# Patient Record
Sex: Male | Born: 1955 | Race: White | Hispanic: No | Marital: Married | Smoking: Former smoker
Health system: Southern US, Community
[De-identification: ages and names within clinical notes are randomized; demographics above are authoritative.]

## PROBLEM LIST (undated history)

## (undated) DIAGNOSIS — T07XXXA Unspecified multiple injuries, initial encounter: Secondary | ICD-10-CM

## (undated) DIAGNOSIS — M1990 Unspecified osteoarthritis, unspecified site: Secondary | ICD-10-CM

## (undated) DIAGNOSIS — S42009A Fracture of unspecified part of unspecified clavicle, initial encounter for closed fracture: Secondary | ICD-10-CM

## (undated) DIAGNOSIS — K219 Gastro-esophageal reflux disease without esophagitis: Secondary | ICD-10-CM

## (undated) DIAGNOSIS — N529 Male erectile dysfunction, unspecified: Secondary | ICD-10-CM

## (undated) HISTORY — DX: Fracture of unspecified part of unspecified clavicle, initial encounter for closed fracture: S42.009A

## (undated) HISTORY — DX: Gastro-esophageal reflux disease without esophagitis: K21.9

## (undated) HISTORY — DX: Unspecified osteoarthritis, unspecified site: M19.90

## (undated) HISTORY — DX: Unspecified multiple injuries, initial encounter: T07.XXXA

## (undated) HISTORY — PX: FINGER FRACTURE SURGERY: SHX638

## (undated) HISTORY — DX: Male erectile dysfunction, unspecified: N52.9

---

## 1996-02-08 HISTORY — PX: THROAT SURGERY: SHX803

## 2004-09-07 ENCOUNTER — Emergency Department: Payer: Self-pay | Admitting: Emergency Medicine

## 2007-03-28 ENCOUNTER — Ambulatory Visit: Payer: Self-pay | Admitting: Unknown Physician Specialty

## 2007-07-02 ENCOUNTER — Ambulatory Visit: Payer: Self-pay | Admitting: Surgery

## 2009-06-03 ENCOUNTER — Ambulatory Visit: Payer: Self-pay | Admitting: Gastroenterology

## 2010-01-01 IMAGING — RF DG BARIUM SWALLOW
1 series · 15 of 24 positions shown · non-contrast
Comparison: none

REASON FOR EXAM: reflux
COMMENTS:

[Series 1: run · 19 acquisitions, 15 frames shown]
[im 1/19]
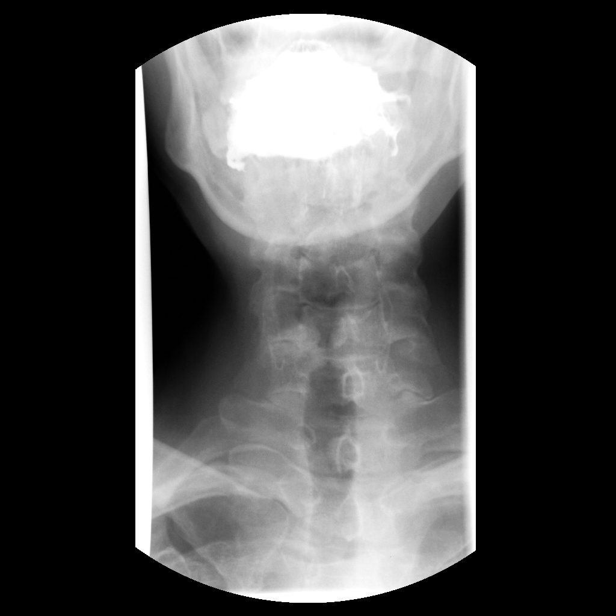
[im 1/19]
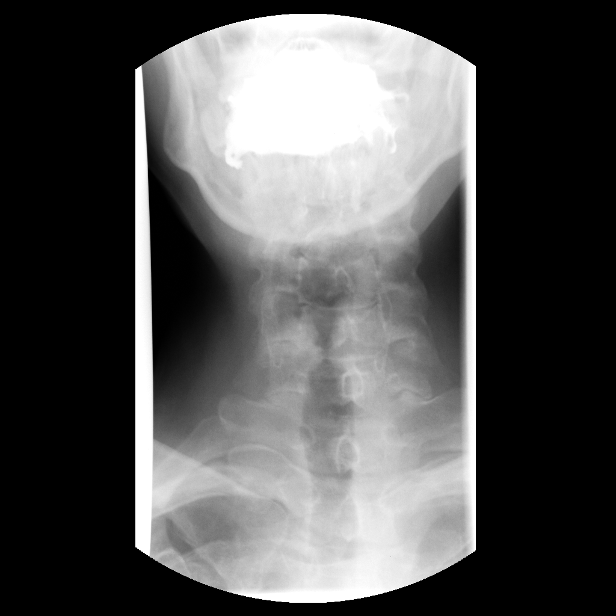
[im 2/19]
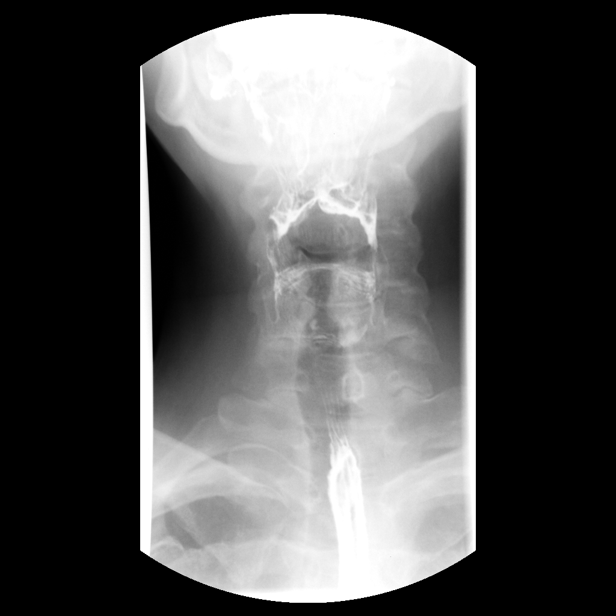
[im 2/19]
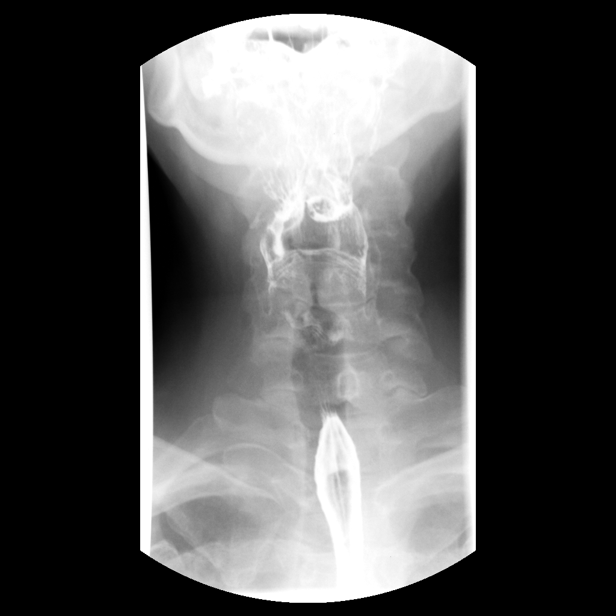
[im 3/19]
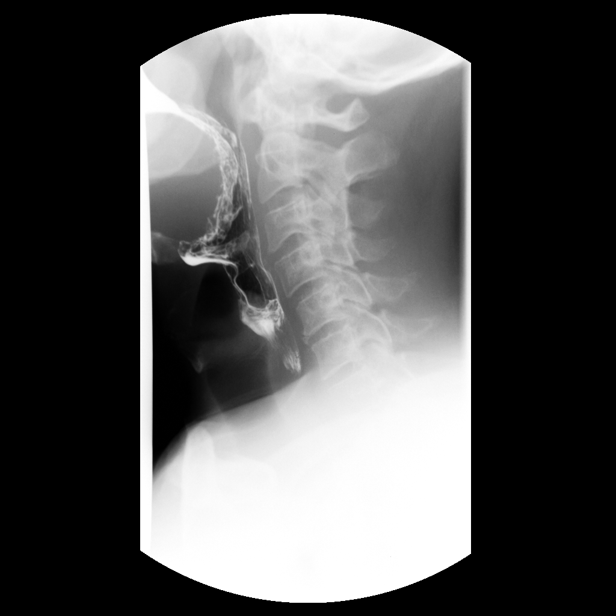
[im 3/19]
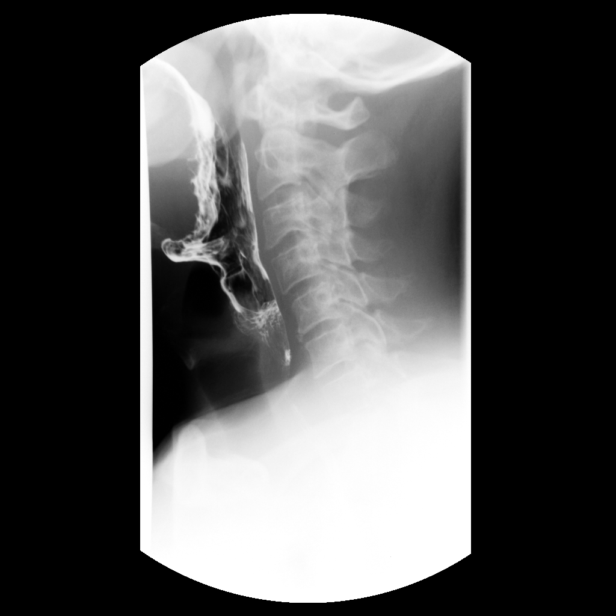
[im 4/19]
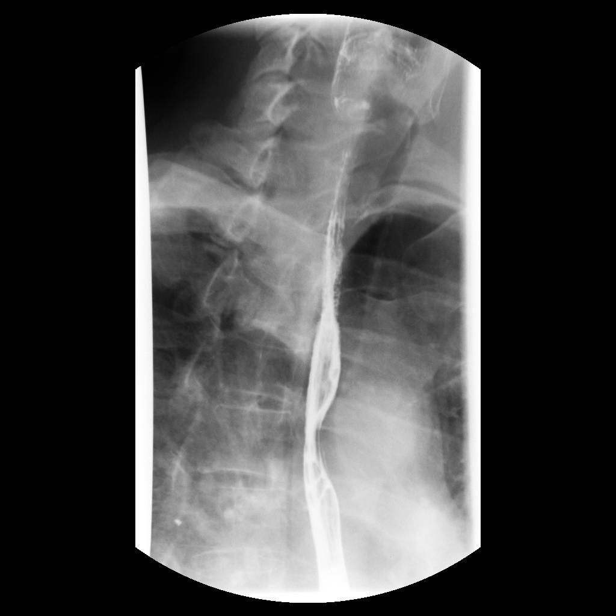
[im 7/19]
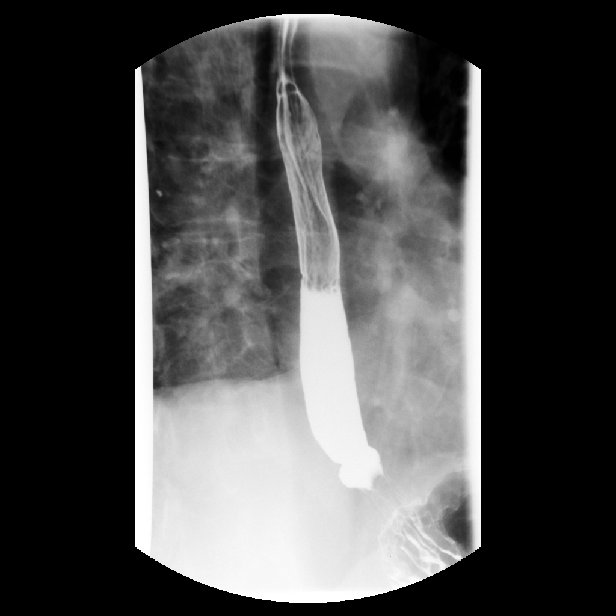
[im 8/19]
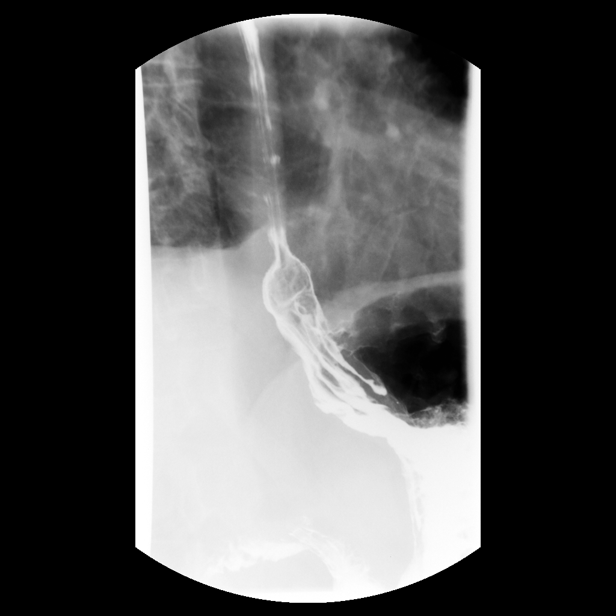
[im 10/19]
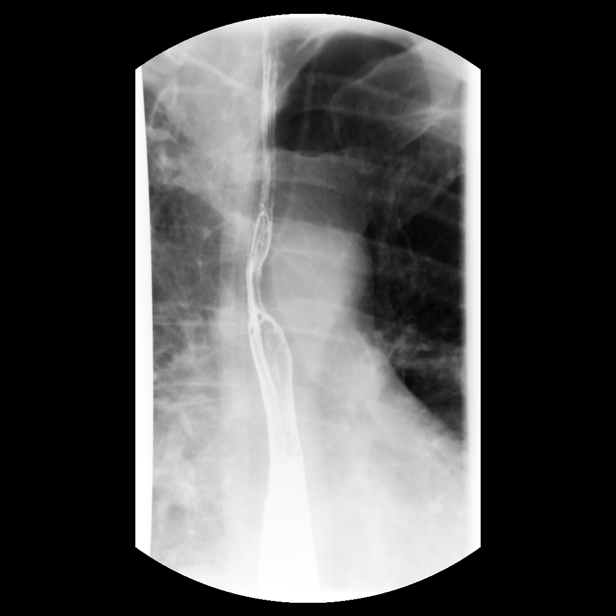
[im 11/19]
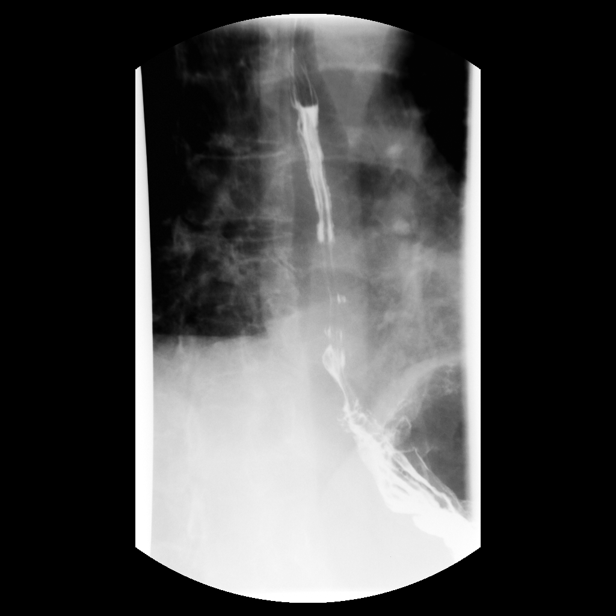
[im 13/19]
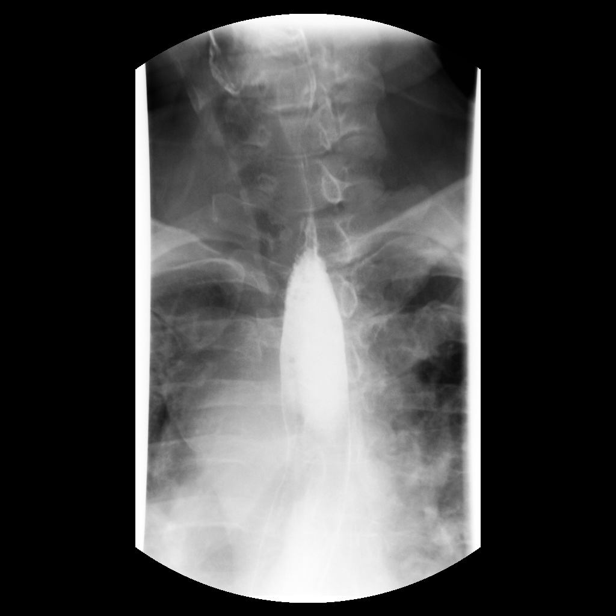
[im 16/19]
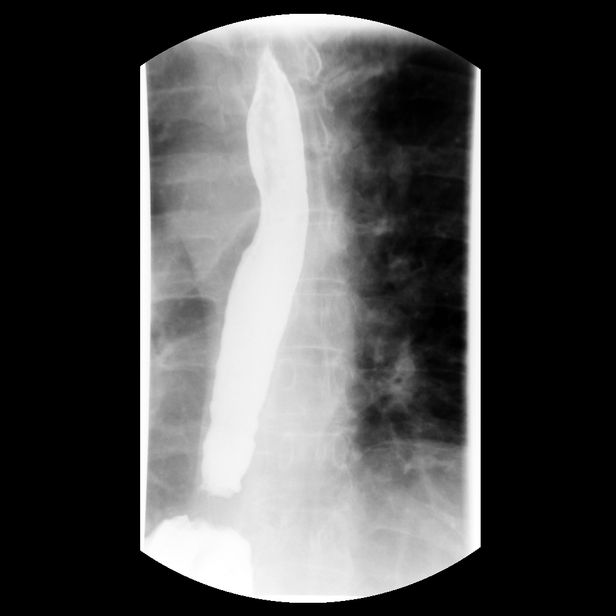
[im 17/19]
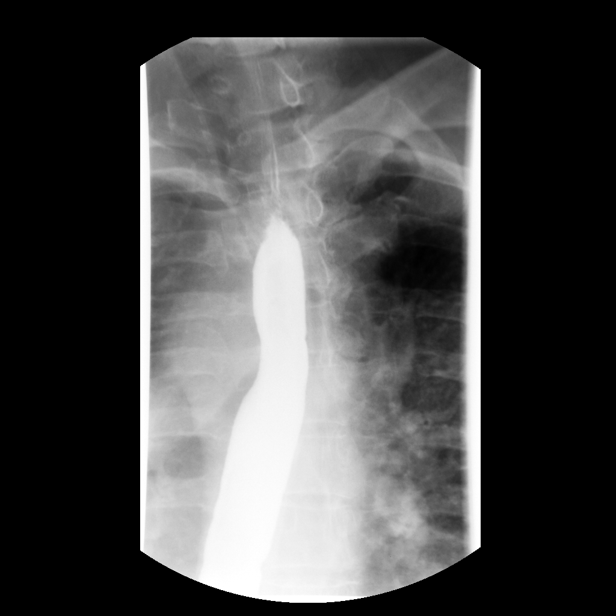
[im 19/19]
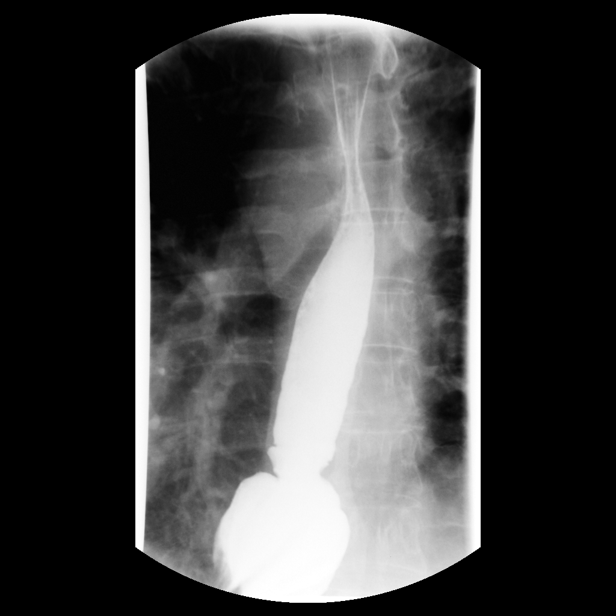

[15 of 24 positions shown; findings below may reference images not displayed]

PROCEDURE:     FL  - FL BARIUM SWALLOW  - July 02, 2007  [DATE]

RESULT:     The patient was given oral barium status post administration of
effervescent crystals and the esophagus was evaluated.

Dynamic evaluation of the cervical esophagus during swallowing demonstrates
no radiographic abnormalities. The thoracic esophagus demonstrates no
evidence of filling defects, focal outpouchings, or fusiform dilatation.
There is appropriate relaxation of the lower esophageal sphincter. A small
to moderate size hiatal hernia is appreciated within the distal esophagus at
the level of the gastroesophageal junction. This appears to be fixed.
Maneuvers were performed to elicit gastroesophageal reflux and the patient
demonstrated reflux to the upper esophagus. Evaluation of a single swallow
demonstrated no evidence of esophageal dysmotility. A 12.5 mm Barotab was
administered which traversed the esophagus without complications.
IMPRESSION: 1.Small to moderate hiatal hernia.

2.Gastroesophageal reflux as described above.

## 2010-11-17 ENCOUNTER — Ambulatory Visit: Payer: Self-pay | Admitting: Gastroenterology

## 2012-05-15 DIAGNOSIS — K219 Gastro-esophageal reflux disease without esophagitis: Secondary | ICD-10-CM | POA: Insufficient documentation

## 2014-02-07 HISTORY — PX: COLONOSCOPY: SHX174

## 2014-07-23 ENCOUNTER — Other Ambulatory Visit: Payer: Self-pay | Admitting: Physician Assistant

## 2014-07-23 ENCOUNTER — Ambulatory Visit
Admission: RE | Admit: 2014-07-23 | Discharge: 2014-07-23 | Disposition: A | Discharge status: Home / Self Care | Payer: BC Managed Care – PPO | Source: Ambulatory Visit | Attending: Physician Assistant | Admitting: Physician Assistant

## 2014-07-23 DIAGNOSIS — R05 Cough: Secondary | ICD-10-CM | POA: Insufficient documentation

## 2014-07-23 DIAGNOSIS — R059 Cough, unspecified: Secondary | ICD-10-CM

## 2014-09-19 ENCOUNTER — Encounter: Payer: Self-pay | Admitting: Family Medicine

## 2014-09-19 ENCOUNTER — Ambulatory Visit (INDEPENDENT_AMBULATORY_CARE_PROVIDER_SITE_OTHER): Payer: BC Managed Care – PPO | Admitting: Family Medicine

## 2014-09-19 ENCOUNTER — Encounter (INDEPENDENT_AMBULATORY_CARE_PROVIDER_SITE_OTHER): Payer: Self-pay

## 2014-09-19 VITALS — BP 134/80 | HR 95 | Temp 97.4°F | Resp 18 | Ht 73.0 in | Wt 219.8 lb

## 2014-09-19 DIAGNOSIS — E663 Overweight: Secondary | ICD-10-CM | POA: Diagnosis not present

## 2014-09-19 DIAGNOSIS — E785 Hyperlipidemia, unspecified: Secondary | ICD-10-CM | POA: Diagnosis not present

## 2014-09-19 DIAGNOSIS — G4700 Insomnia, unspecified: Secondary | ICD-10-CM | POA: Diagnosis not present

## 2014-09-19 DIAGNOSIS — R7301 Impaired fasting glucose: Secondary | ICD-10-CM | POA: Diagnosis not present

## 2014-09-19 DIAGNOSIS — R413 Other amnesia: Secondary | ICD-10-CM

## 2014-09-19 DIAGNOSIS — K219 Gastro-esophageal reflux disease without esophagitis: Secondary | ICD-10-CM | POA: Diagnosis not present

## 2014-09-19 NOTE — Patient Instructions (Signed)
Fat and Cholesterol Control Diet Fat and cholesterol levels in your blood and organs are influenced by your diet. High levels of fat and cholesterol may lead to diseases of the heart, small and large blood vessels, gallbladder, liver, and pancreas. CONTROLLING FAT AND CHOLESTEROL WITH DIET Although exercise and lifestyle factors are important, your diet is key. That is because certain foods are known to raise cholesterol and others to lower it. The goal is to balance foods for their effect on cholesterol and more importantly, to replace saturated and trans fat with other types of fat, such as monounsaturated fat, polyunsaturated fat, and omega-3 fatty acids. On average, a person should consume no more than 15 to 17 g of saturated fat daily. Saturated and trans fats are considered "bad" fats, and they will raise LDL cholesterol. Saturated fats are primarily found in animal products such as meats, butter, and cream. However, that does not mean you need to give up all your favorite foods. Today, there are good tasting, low-fat, low-cholesterol substitutes for most of the things you like to eat. Choose low-fat or nonfat alternatives. Choose round or loin cuts of red meat. These types of cuts are lowest in fat and cholesterol. Chicken (without the skin), fish, veal, and ground turkey breast are great choices. Eliminate fatty meats, such as hot dogs and salami. Even shellfish have little or no saturated fat. Have a 3 oz (85 g) portion when you eat lean meat, poultry, or fish. Trans fats are also called "partially hydrogenated oils." They are oils that have been scientifically manipulated so that they are solid at room temperature resulting in a longer shelf life and improved taste and texture of foods in which they are added. Trans fats are found in stick margarine, some tub margarines, cookies, crackers, and baked goods.  When baking and cooking, oils are a great substitute for butter. The monounsaturated oils are  especially beneficial since it is believed they lower LDL and raise HDL. The oils you should avoid entirely are saturated tropical oils, such as coconut and palm.  Remember to eat a lot from food groups that are naturally free of saturated and trans fat, including fish, fruit, vegetables, beans, grains (barley, rice, couscous, bulgur wheat), and pasta (without cream sauces).  IDENTIFYING FOODS THAT LOWER FAT AND CHOLESTEROL  Soluble fiber may lower your cholesterol. This type of fiber is found in fruits such as apples, vegetables such as broccoli, potatoes, and carrots, legumes such as beans, peas, and lentils, and grains such as barley. Foods fortified with plant sterols (phytosterol) may also lower cholesterol. You should eat at least 2 g per day of these foods for a cholesterol lowering effect.  Read package labels to identify low-saturated fats, trans fat free, and low-fat foods at the supermarket. Select cheeses that have only 2 to 3 g saturated fat per ounce. Use a heart-healthy tub margarine that is free of trans fats or partially hydrogenated oil. When buying baked goods (cookies, crackers), avoid partially hydrogenated oils. Breads and muffins should be made from whole grains (whole-wheat or whole oat flour, instead of "flour" or "enriched flour"). Buy non-creamy canned soups with reduced salt and no added fats.  FOOD PREPARATION TECHNIQUES  Never deep-fry. If you must fry, either stir-fry, which uses very little fat, or use non-stick cooking sprays. When possible, broil, bake, or roast meats, and steam vegetables. Instead of putting butter or margarine on vegetables, use lemon and herbs, applesauce, and cinnamon (for squash and sweet potatoes). Use nonfat   yogurt, salsa, and low-fat dressings for salads.  LOW-SATURATED FAT / LOW-FAT FOOD SUBSTITUTES Meats / Saturated Fat (g)  Avoid: Steak, marbled (3 oz/85 g) / 11 g  Choose: Steak, lean (3 oz/85 g) / 4 g  Avoid: Hamburger (3 oz/85 g) / 7  g  Choose: Hamburger, lean (3 oz/85 g) / 5 g  Avoid: Ham (3 oz/85 g) / 6 g  Choose: Ham, lean cut (3 oz/85 g) / 2.4 g  Avoid: Chicken, with skin, dark meat (3 oz/85 g) / 4 g  Choose: Chicken, skin removed, dark meat (3 oz/85 g) / 2 g  Avoid: Chicken, with skin, light meat (3 oz/85 g) / 2.5 g  Choose: Chicken, skin removed, light meat (3 oz/85 g) / 1 g Dairy / Saturated Fat (g)  Avoid: Whole milk (1 cup) / 5 g  Choose: Low-fat milk, 2% (1 cup) / 3 g  Choose: Low-fat milk, 1% (1 cup) / 1.5 g  Choose: Skim milk (1 cup) / 0.3 g  Avoid: Hard cheese (1 oz/28 g) / 6 g  Choose: Skim milk cheese (1 oz/28 g) / 2 to 3 g  Avoid: Cottage cheese, 4% fat (1 cup) / 6.5 g  Choose: Low-fat cottage cheese, 1% fat (1 cup) / 1.5 g  Avoid: Ice cream (1 cup) / 9 g  Choose: Sherbet (1 cup) / 2.5 g  Choose: Nonfat frozen yogurt (1 cup) / 0.3 g  Choose: Frozen fruit bar / trace  Avoid: Whipped cream (1 tbs) / 3.5 g  Choose: Nondairy whipped topping (1 tbs) / 1 g Condiments / Saturated Fat (g)  Avoid: Mayonnaise (1 tbs) / 2 g  Choose: Low-fat mayonnaise (1 tbs) / 1 g  Avoid: Butter (1 tbs) / 7 g  Choose: Extra light margarine (1 tbs) / 1 g  Avoid: Coconut oil (1 tbs) / 11.8 g  Choose: Olive oil (1 tbs) / 1.8 g  Choose: Corn oil (1 tbs) / 1.7 g  Choose: Safflower oil (1 tbs) / 1.2 g  Choose: Sunflower oil (1 tbs) / 1.4 g  Choose: Soybean oil (1 tbs) / 2.4 g  Choose: Canola oil (1 tbs) / 1 g Document Released: 01/24/2005 Document Revised: 05/21/2012 Document Reviewed: 04/24/2013 [employer] Patient Information 2015 [employer]. This information is not intended to replace advice given to you by your health care provider. Make sure you discuss any questions you have with your health care provider.  

## 2014-09-19 NOTE — Progress Notes (Signed)
Name: Brian Estrada   MRN: [id number]    DOB: November 16, 1955   Date:09/19/2014       Progress Note  Subjective  Chief Complaint  Chief Complaint  Patient presents with  . Establish Care    patient is concerned about his memory.   . Circulatory Problem    HPI  Mr. Brian Estrada ina 59 year old male who is here to establish care and discuss his concerns regarding change in memory and possible circulatory dysfunction. He has a history of GERD for which he has taken Nexium for many years. Other than that he does have on and off elevated hyperlipidemia. He most recently had blood work done 04/2014 which reflected elevated cholesterol and fasting blood glucose. He has since changed his diet around to a low fat low carbohydrate diet and avoiding red meats. He has no personal history of MI, CAD or CVA but father did have Alzheimer's Dementia and possible cerebral vascular disease due to alcohol consumption. Mr. Blucher denies chest pain, shortness of breath or palpitations. 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  For his back and joint pain he occasionally uses NSAIDs or tramadol but he doesn't prefer prescription medication if he can avoid it. Tramadol can make him itchy by 5 am he notes.  His sleep is not consistent. Often his mind is racing with thoughts of projects and measurements and tasks he has to do the next day. It takes him 4mins or more to fall asleep but once asleep he stays asleep for 7 hours. With this he also has had recent work searching issues mid sentence. He is not getting lost, losing items or having hard time with ADLs or IADLs.   Patient Active Problem List   Diagnosis Date Noted  . 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 Smoking status: Light Tobacco Smoker    Types: Cigars  . Smokeless tobacco: Not on file     Comment: patient use to dip about 29yrs  ago, but smokes a cigar maybe 1/month  . Alcohol Use: 0.0 oz/week    0 Standard drinks or equivalent per week     Comment: [place] and a Ginger Ale with a lime socially     Current outpatient prescriptions:  .  traMADol (ULTRAM) 50 MG tablet, Take by mouth every 6 (six) hours as needed., Disp: , Rfl:  .  esomeprazole (NEXIUM) 40 MG capsule, Take by mouth., Disp: , Rfl:   Past Surgical History  Procedure Laterality Date  . Throat surgery  1998    granuloma removed  . Finger fracture surgery Right     Family History  Problem Relation Age of Onset  . ALS Mother   . Diabetes Mother   . Alzheimer's disease Father   . Dementia Father   . Cancer Father   . Alcohol abuse Father   . Gallbladder disease Sister   . Cancer Brother     lung    Allergies  Allergen Reactions  . Codeine Itching     Review of Systems  CONSTITUTIONAL: No significant weight changes, fever, chills, weakness or fatigue.  HEENT:  - Eyes: No visual changes.  - Ears: No auditory changes. No pain.  - Nose: No sneezing, congestion, runny nose. - Throat: No sore throat. No changes in swallowing. SKIN: No rash or itching.  CARDIOVASCULAR: No chest pain, chest pressure or chest discomfort.  No palpitations or edema.  RESPIRATORY: No shortness of breath, cough or sputum.  GASTROINTESTINAL: No anorexia, nausea, vomiting. No changes in bowel habits. No abdominal pain or blood.  GENITOURINARY: No dysuria. No frequency. No discharge.  NEUROLOGICAL: No headache, dizziness, syncope, paralysis, ataxia, numbness or tingling in the extremities. No memory changes. No change in bowel or bladder control.  MUSCULOSKELETAL: No joint pain. No muscle pain. HEMATOLOGIC: No anemia, bleeding or bruising.  LYMPHATICS: No enlarged lymph nodes.  PSYCHIATRIC: No change in mood. Yes change in memory and change in sleep pattern.  ENDOCRINOLOGIC: No reports of sweating, cold or heat intolerance. No polyuria or polydipsia.      Objective  BP 134/80 mmHg  Pulse 95  Temp(Src) 97.4 F (36.3 C) (Oral)  Resp 18  Ht 6\' 1"  (1.854 m)  Wt 219 lb 12.8 oz (99.701 kg)  BMI 29.01 kg/m2  SpO2 97% Body mass index is 29.01 kg/(m^2).  Physical Exam  Constitutional: Patient has large frame and well-nourished. In no distress.  HEENT:  - Head: Normocephalic and atraumatic.  - Ears: Bilateral TMs gray, no erythema or effusion - Nose: Nasal mucosa moist - Mouth/Throat: Oropharynx is clear and moist. No tonsillar hypertrophy or erythema. No post nasal drainage.  - Eyes: Conjunctivae clear, EOM movements normal. PERRLA. No scleral icterus.  Neck: Normal range of motion. Neck supple. No JVD present. No thyromegaly present.  Cardiovascular: Normal rate, regular rhythm and normal heart sounds.  No murmur heard.  Pulmonary/Chest: Effort normal and breath sounds normal. No respiratory distress. Musculoskeletal: Normal range of motion bilateral UE and LE, no joint effusions. Peripheral vascular: Bilateral LE no edema. Neurological: CN II-XII grossly intact with no focal deficits. Alert and oriented to person, place, and time. Coordination, balance, strength, speech and gait are normal.  Skin: Skin is warm and dry. No rash noted. No erythema.  Psychiatric: Patient has a normal mood and affect. Behavior is normal in office today. Judgment and thought content normal in office today.    Assessment & Plan  1. GERD without esophagitis Stable and improved with dietary changes. Discussed recent literature suggesting possible link between PPI and Dementia.  2. Overweight (BMI 25.0-29.9) The patient has been counseled on their higher than normal BMI.  They have verbally expressed understanding their increased risk for other diseases.  In efforts to meet a better target BMI goal the patient has been counseled on lifestyle, diet and exercise modification tactics. Start with moderate intensity aerobic exercise (walking, jogging,  elliptical, swimming, group or individual sports, hiking) at least 62mins a day at least 4 days a week and increase intensity, duration, frequency as tolerated. Diet should include well balance fresh fruits and vegetables avoiding processed foods, carbohydrates and sugars. Drink at least 8oz 10 glasses a day avoiding sodas, sugary fruit drinks, sweetened tea. Check weight on a reliable scale daily and monitor weight loss progress daily. Consider investing in mobile phone apps that will help keep track of weight loss goals.   3. Hyperlipidemia LDL goal <100 Was previously on Simvastatin. I will repeat blood work and encourage him to start Statin for cardiovascular protection.  - Lipid panel  4. Insomnia Recommended good sleep hygiene and trial of melatonin.   5. Fasting hyperglycemia Will check Hba1c, likely in pre-diabetes stage.  - Hemoglobin A1c  6. Memory changes Normal age related changes.  He is still quite active and mentally engaged with tasks. We discussed further signs and symptoms for him to look out for.

## 2014-10-07 ENCOUNTER — Encounter: Payer: Self-pay | Admitting: Family Medicine

## 2015-03-06 ENCOUNTER — Other Ambulatory Visit: Payer: Self-pay | Admitting: Family Medicine

## 2015-03-06 DIAGNOSIS — E785 Hyperlipidemia, unspecified: Secondary | ICD-10-CM

## 2015-03-06 LAB — LIPID PANEL
CHOLESTEROL TOTAL: 281 mg/dL — AB (ref 100–199)
Chol/HDL Ratio: 7.4 ratio units — ABNORMAL HIGH (ref 0.0–5.0)
HDL: 38 mg/dL — ABNORMAL LOW (ref >39)
LDL Calculated: 190 mg/dL — ABNORMAL HIGH (ref 0–99)
TRIGLYCERIDES: 263 mg/dL — AB (ref 0–149)
VLDL Cholesterol Cal: 53 mg/dL — ABNORMAL HIGH (ref 5–40)

## 2015-03-06 LAB — HEMOGLOBIN A1C
Est. average glucose Bld gHb Est-mCnc: 126 mg/dL
HEMOGLOBIN A1C: 6 % — AB (ref 4.8–5.6)

## 2015-03-06 MED ORDER — PRAVASTATIN SODIUM 20 MG PO TABS: 20.0000 mg | ORAL_TABLET | Freq: Every day | ORAL | Status: AC

## 2016-01-21 DIAGNOSIS — Z86010 Personal history of colonic polyps: Secondary | ICD-10-CM | POA: Insufficient documentation

## 2017-08-18 ENCOUNTER — Encounter: Payer: Self-pay | Admitting: *Deleted

## 2017-09-14 ENCOUNTER — Ambulatory Visit (INDEPENDENT_AMBULATORY_CARE_PROVIDER_SITE_OTHER): Payer: BLUE CROSS/BLUE SHIELD | Admitting: General Surgery

## 2017-09-14 ENCOUNTER — Encounter: Payer: Self-pay | Admitting: General Surgery

## 2017-09-14 VITALS — BP 140/70 | HR 79 | Resp 14 | Ht 73.0 in | Wt 234.0 lb

## 2017-09-14 DIAGNOSIS — D179 Benign lipomatous neoplasm, unspecified: Secondary | ICD-10-CM | POA: Diagnosis not present

## 2017-09-14 NOTE — Patient Instructions (Signed)
The patient is aware to call back for any questions or new concerns.  

## 2017-09-14 NOTE — Progress Notes (Signed)
Patient ID: Brian Estrada, male   DOB: Jul 02, 1955, 62 y.o.   MRN: [id number]  Chief Complaint  Patient presents with  . Mass    HPI Brian Estrada is a 62 y.o. male.  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 3 left stomach and 1 left flank and 2 on right side stomach and 1 on right thigh referred by Dr Kary Kos. The one in stomach area has been within the last year causing some discomfort on the left lateral side. The one on his thigh has been there for 5-7 years. He is an Chief Financial Officer for [employer]. He is here with his wife, [name].  HPI  Past Medical History:  Diagnosis Date  . Arthritis   . Broken collarbone   . Erectile dysfunction   . GERD (gastroesophageal reflux disease)   . Multiple fracture    both arms,     Past Surgical History:  Procedure Laterality Date  . COLONOSCOPY  2016  . FINGER FRACTURE SURGERY Right   . THROAT SURGERY  1998   granuloma removed    Family History  Problem Relation Age of Onset  . ALS Mother   . Diabetes Mother   . Alzheimer's disease Father   . Dementia Father   . Cancer Father   . Alcohol abuse Father   . Gallbladder disease Sister   . Cancer Brother        lung    Social History Social History   Tobacco Use  . Smoking status: Former Smoker    Types: Cigars    Last attempt to quit: 02/07/2017    Years since quitting: 0.6  . Smokeless tobacco: Former Systems developer    Types: Snuff    Quit date: 02/08/1999  . Tobacco comment: patient use to dip about 6yrs ago, but smokes a cigar maybe 1/month  Substance Use Topics  . 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 Drug use: No    Comment: History of Marijuana    Allergies  Allergen Reactions  . Codeine Itching    Current Outpatient Medications  Medication Sig Dispense Refill  . esomeprazole (NEXIUM) 40 MG capsule Take 40 mg by mouth daily at 12 noon.     . naproxen sodium  (ALEVE) 220 MG tablet Take 220 mg by mouth daily as needed.    . pravastatin (PRAVACHOL) 20 MG tablet Take 1 tablet (20 mg total) by mouth daily. 90 tablet 2  . simvastatin (ZOCOR) 20 MG tablet Take 20 mg by mouth daily at 6 PM.      No current facility-administered medications for this visit.     Review of Systems Review of Systems  Constitutional: Negative.   Respiratory: Negative.   Cardiovascular: Negative.     Blood pressure 140/70, pulse 79, resp. rate 14, height 6\' 1"  (1.854 m), weight 234 lb (106.1 kg).  Physical Exam Physical Exam  Constitutional: He is oriented to person, place, and time. He appears well-developed and well-nourished.  HENT:  Mouth/Throat: No oropharyngeal exudate.  Eyes: Conjunctivae are normal. No scleral icterus.  Neck: Neck supple.  Abdominal: Soft. Normal appearance. There is no tenderness.    Neurological: He is alert and oriented to person, place, and time.  Skin: Skin is warm and dry.  1 cm lipoma right anterior axillary line. Half dollar size lipoma right anterior lateral thigh.   Psychiatric: His behavior is normal.  Data Reviewed PCP notes.  Assessment    Soft tissue lipomas, minimally symptomatic.    Plan    Observation warranted at this time unless the areas become larger or more symptomatic.  Follow up as needed or if symptoms change or worsen. The patient is aware to call back for any questions or new concerns.   HPI, Physical Exam, Assessment and Plan have been scribed under the direction and in the presence of Robert Bellow, MD. Karie Fetch, RN  I have completed the exam and reviewed the above documentation for accuracy and completeness.  I agree with the above.  Haematologist has been used and any errors in dictation or transcription are unintentional.  Hervey Ard, M.D., F.A.C.S.   Forest Gleason Derelle Cockrell 09/15/2017, 1:59 PM

## 2017-09-15 DIAGNOSIS — D179 Benign lipomatous neoplasm, unspecified: Secondary | ICD-10-CM | POA: Insufficient documentation

## 2019-10-22 ENCOUNTER — Emergency Department
Admission: EM | Admit: 2019-10-22 | Discharge: 2019-10-22 | Discharge status: Absent without leave | Payer: BC Managed Care – PPO

## 2019-10-22 MED ORDER — IPRATROPIUM-ALBUTEROL 0.5-2.5 (3) MG/3ML IN SOLN
RESPIRATORY_TRACT | Status: AC
  Filled 2019-10-22: qty 9

## 2019-10-22 MED ORDER — METHYLPREDNISOLONE SODIUM SUCC 125 MG IJ SOLR
INTRAMUSCULAR | Status: AC
  Filled 2019-10-22: qty 2

## 2019-10-28 ENCOUNTER — Ambulatory Visit: Payer: BC Managed Care – PPO | Admitting: Dermatology

## 2020-07-01 ENCOUNTER — Ambulatory Visit: Payer: BC Managed Care – PPO | Admitting: Dermatology

## 2020-07-30 ENCOUNTER — Encounter (INDEPENDENT_AMBULATORY_CARE_PROVIDER_SITE_OTHER): Payer: BC Managed Care – PPO | Admitting: Vascular Surgery

## 2020-08-27 ENCOUNTER — Other Ambulatory Visit: Payer: Self-pay

## 2020-08-27 ENCOUNTER — Ambulatory Visit (INDEPENDENT_AMBULATORY_CARE_PROVIDER_SITE_OTHER): Payer: BC Managed Care – PPO | Admitting: Vascular Surgery

## 2020-08-27 VITALS — BP 137/88 | HR 75 | Ht 73.0 in | Wt 236.0 lb

## 2020-08-27 DIAGNOSIS — E785 Hyperlipidemia, unspecified: Secondary | ICD-10-CM

## 2020-08-27 DIAGNOSIS — K219 Gastro-esophageal reflux disease without esophagitis: Secondary | ICD-10-CM

## 2020-08-27 DIAGNOSIS — I8310 Varicose veins of unspecified lower extremity with inflammation: Secondary | ICD-10-CM

## 2020-08-27 NOTE — Progress Notes (Signed)
MRN : [id number]  Brian Estrada is a 65 y.o. (05/13/1955) male who presents with chief complaint of No chief complaint on file. [name]  History of Present Illness:   The patient is seen for evaluation of varicose veins. The patient relates they were prominent when he was standing on a ladder.  He denies any symptoms no pain or burning.  At this point, the symptoms are not having a negative impact on lifestyle and are interfering with daily activities.  Patient denies claudication symptoms.  There is no history of DVT, PE or superficial thrombophlebitis. There is no history of ulceration or hemorrhage. The patient denies a significant family history of varicose veins.  The patient has not worn graduated compression in the past. At the present time the patient has not been using over-the-counter analgesics. There is no history of prior surgical intervention or sclerotherapy.    No outpatient medications have been marked as taking for the 08/27/20 encounter (Appointment) with Delana Meyer, Dolores Lory, MD.    Past Medical History:  Diagnosis Date   Arthritis    Broken collarbone    Erectile dysfunction    GERD (gastroesophageal reflux disease)    Multiple fracture    both arms,     Past Surgical History:  Procedure Laterality Date   COLONOSCOPY  2016   FINGER FRACTURE SURGERY Right    THROAT SURGERY  1998   granuloma removed    Social History Social History   Tobacco Use   Smoking status: Former    Types: Cigars    Quit date: 02/07/2017    Years since quitting: 3.5   Smokeless tobacco: Former    Types: Snuff    Quit date: 02/08/1999   Tobacco comments:    patient use to dip about 63yrs ago, but smokes a cigar maybe 1/month  Substance Use Topics   Alcohol use: Yes    Alcohol/week: 0.0 standard drinks    Comment: [employer] farm [employer] and a Ginger Ale with a lime socially   Drug use: No    Comment: History of Marijuana    Family History Family History  Problem Relation Age of  Onset   ALS Mother    Diabetes Mother    Alzheimer's disease Father    Dementia Father    Cancer Father    Alcohol abuse Father    Gallbladder disease Sister    Cancer Brother        lung  No family history of bleeding/clotting disorders, porphyria or autoimmune disease   Allergies  Allergen Reactions   Codeine Itching     REVIEW OF SYSTEMS (Negative unless checked)  Constitutional: [] Weight loss  [] Fever  [] Chills Cardiac: [] Chest pain   [] Chest pressure   [] Palpitations   [] Shortness of breath when laying flat   [] Shortness of breath with exertion. Vascular:  [] Pain in legs with walking   [] Pain in legs at rest  [] History of DVT   [] Phlebitis   [x] Swelling in legs   [x] Varicose veins   [] Non-healing ulcers Pulmonary:   [] Uses home oxygen   [] Productive cough   [] Hemoptysis   [] Wheeze  [] COPD   [] Asthma Neurologic:  [] Dizziness   [] Seizures   [] History of stroke   [] History of TIA  [] Aphasia   [] Vissual changes   [] Weakness or numbness in arm   [] Weakness or numbness in leg Musculoskeletal:   [] Joint swelling   [] Joint pain   [] Low back pain Hematologic:  [] Easy bruising  [] Easy bleeding   [] Hypercoagulable state   []   Anemic Gastrointestinal:  [] Diarrhea   [] Vomiting  [x] Gastroesophageal reflux/heartburn   [] Difficulty swallowing. Genitourinary:  [] Chronic kidney disease   [] Difficult urination  [] Frequent urination   [] Blood in urine Skin:  [x] Rashes   [] Ulcers  Psychological:  [] History of anxiety   []  History of major depression.  Physical Examination  There were no vitals filed for this visit. There is no height or weight on file to calculate BMI. Gen: WD/WN, NAD Head: [place]/AT, No temporalis wasting.  Ear/Nose/Throat: Hearing grossly intact, nares w/o erythema or drainage, poor dentition Eyes: PER, EOMI, sclera nonicteric.  Neck: Supple, no masses.  No bruit or JVD.  Pulmonary:  Good air movement, clear to auscultation bilaterally, no use of accessory muscles.  Cardiac:  RRR, normal S1, S2, no Murmurs. Vascular:    scattered varicosities present bilaterally.  Mild venous stasis changes to the legs bilaterally.  2+ soft pitting edema  Vessel Right Left  Radial Palpable Palpable  PT Palpable Palpable  DP Palpable Palpable  Gastrointestinal: soft, non-distended. No guarding/no peritoneal signs.  Musculoskeletal: M/S 5/5 throughout.  No deformity or atrophy.  Neurologic: CN 2-12 intact. Pain and light touch intact in extremities.  Symmetrical.  Speech is fluent. Motor exam as listed above. Psychiatric: Judgment intact, Mood & affect appropriate for pt's clinical situation. Dermatologic: mild venous rashes or ulcers noted.  No changes consistent with cellulitis. Lymph : No Cervical lymphadenopathy, no lichenification or skin changes of chronic lymphedema.  CBC No results found for: WBC, HGB, HCT, MCV, PLT  BMET No results found for: NA, K, CL, CO2, GLUCOSE, BUN, CREATININE, CALCIUM, GFRNONAA, GFRAA CrCl cannot be calculated (No successful lab value found.).  COAG No results found for: INR, PROTIME  Radiology No results found.   Assessment/Plan 1. Varicose veins with inflammation Recommend:  The patient is complaining of varicose veins.    I have had a long discussion with the patient regarding  varicose veins and why they cause symptoms.  Patient will begin wearing graduated compression stockings on a daily basis, beginning first thing in the morning and removing them in the evening. The patient is instructed specifically not to sleep in the stockings.    The patient  will also begin using over-the-counter analgesics such as Motrin 600 mg po TID to help control the symptoms as needed.    In addition, behavioral modification including elevation during the day will be initiated, utilizing a recliner was recommended.  The patient is also instructed to continue exercising such as walking 4-5 times per week.  At this time the patient wishes to continue  conservative therapy and is not interested in more invasive treatments such as laser ablation and sclerotherapy.  The Patient will follow up PRN if the symptoms worsen.   2. GERD without esophagitis Continue PPI as already ordered, this medication has been reviewed and there are no changes at this time.  Avoidence of caffeine and alcohol  Moderate elevation of the head of the bed    3. Hyperlipidemia LDL goal <100 Continue statin as ordered and reviewed, no changes at this time    Hortencia Pilar, MD  08/27/2020 8:44 AM

## 2020-08-28 ENCOUNTER — Encounter (INDEPENDENT_AMBULATORY_CARE_PROVIDER_SITE_OTHER): Payer: Self-pay | Admitting: Vascular Surgery

## 2021-12-06 ENCOUNTER — Encounter (INDEPENDENT_AMBULATORY_CARE_PROVIDER_SITE_OTHER): Payer: Self-pay

## 2022-03-28 ENCOUNTER — Ambulatory Visit: Payer: Medicare PPO

## 2022-03-28 DIAGNOSIS — K641 Second degree hemorrhoids: Secondary | ICD-10-CM

## 2022-03-28 DIAGNOSIS — D123 Benign neoplasm of transverse colon: Secondary | ICD-10-CM

## 2022-03-28 DIAGNOSIS — K222 Esophageal obstruction: Secondary | ICD-10-CM

## 2022-03-28 DIAGNOSIS — K219 Gastro-esophageal reflux disease without esophagitis: Secondary | ICD-10-CM

## 2022-03-28 DIAGNOSIS — Z86010 Personal history of colonic polyps: Secondary | ICD-10-CM

## 2022-03-28 DIAGNOSIS — K449 Diaphragmatic hernia without obstruction or gangrene: Secondary | ICD-10-CM

## 2022-03-28 DIAGNOSIS — R1314 Dysphagia, pharyngoesophageal phase: Secondary | ICD-10-CM
